# Patient Record
Sex: Male | Born: 1990 | Race: White | Hispanic: No
Health system: Southern US, Community
[De-identification: ages and names within clinical notes are randomized; demographics above are authoritative.]

## PROBLEM LIST (undated history)

## (undated) DIAGNOSIS — Z9109 Other allergy status, other than to drugs and biological substances: Secondary | ICD-10-CM

## (undated) DIAGNOSIS — Z8619 Personal history of other infectious and parasitic diseases: Secondary | ICD-10-CM

## (undated) DIAGNOSIS — J302 Other seasonal allergic rhinitis: Secondary | ICD-10-CM

## (undated) DIAGNOSIS — C8590 Non-Hodgkin lymphoma, unspecified, unspecified site: Secondary | ICD-10-CM

## (undated) HISTORY — DX: Non-Hodgkin lymphoma, unspecified, unspecified site: C85.90

## (undated) HISTORY — DX: Personal history of other infectious and parasitic diseases: Z86.19

## (undated) HISTORY — DX: Other seasonal allergic rhinitis: J30.2

## (undated) HISTORY — PX: NO PAST SURGERIES: SHX2092

## (undated) HISTORY — DX: Other allergy status, other than to drugs and biological substances: Z91.09

---

## 2015-08-24 ENCOUNTER — Telehealth: Payer: Self-pay | Admitting: Behavioral Health

## 2015-08-24 NOTE — Telephone Encounter (Signed)
Unable to reach patient at time of Pre-Visit Call.  Left message for patient to return call when available.    

## 2015-08-27 ENCOUNTER — Encounter: Payer: Self-pay | Admitting: Physician Assistant

## 2015-08-27 ENCOUNTER — Ambulatory Visit (INDEPENDENT_AMBULATORY_CARE_PROVIDER_SITE_OTHER): Payer: Managed Care, Other (non HMO) | Admitting: Physician Assistant

## 2015-08-27 VITALS — BP 114/86 | HR 95 | Temp 97.5°F | Resp 16 | Ht 67.0 in | Wt 152.2 lb

## 2015-08-27 DIAGNOSIS — Z8572 Personal history of non-Hodgkin lymphomas: Secondary | ICD-10-CM

## 2015-08-27 DIAGNOSIS — L309 Dermatitis, unspecified: Secondary | ICD-10-CM

## 2015-08-27 DIAGNOSIS — Z859 Personal history of malignant neoplasm, unspecified: Secondary | ICD-10-CM | POA: Diagnosis not present

## 2015-08-27 DIAGNOSIS — L819 Disorder of pigmentation, unspecified: Secondary | ICD-10-CM | POA: Diagnosis not present

## 2015-08-27 LAB — CBC
HEMATOCRIT: 48.7 % (ref 39.0–52.0)
Hemoglobin: 16.9 g/dL (ref 13.0–17.0)
MCHC: 34.7 g/dL (ref 30.0–36.0)
MCV: 89.3 fl (ref 78.0–100.0)
Platelets: 288 10*3/uL (ref 150.0–400.0)
RBC: 5.45 Mil/uL (ref 4.22–5.81)
RDW: 13.6 % (ref 11.5–15.5)
WBC: 8 10*3/uL (ref 4.0–10.5)

## 2015-08-27 MED ORDER — TRIAMCINOLONE ACETONIDE 0.5 % EX CREA: 1.0000 "application " | TOPICAL_CREAM | Freq: Three times a day (TID) | CUTANEOUS | Status: DC

## 2015-08-27 NOTE — Assessment & Plan Note (Signed)
Rx Kenalog ointment BID for 2 weeks then QD as needed. Supportive measures reviewed.

## 2015-08-27 NOTE — Progress Notes (Signed)
Pre visit review using our clinic review tool, if applicable. No additional management support is needed unless otherwise documented below in the visit note/SLS  

## 2015-08-27 NOTE — Patient Instructions (Signed)
Please go to the lab for blood work. I will call you with your results.  Keep your phone on as Dermatology will be calling you for assessment of your skin lesions.  Apply Kenalog cream to eczematous patches on back of legs. Keep up the daily Zyrtec.  Please try to get outside to increase your Vitamin D and try to stay active as it is good for cardiovascular health.

## 2015-08-27 NOTE — Assessment & Plan Note (Signed)
Will check CBC today as he has not had follow-up in several years.

## 2015-08-27 NOTE — Assessment & Plan Note (Signed)
With scaling and ulceration. Will refer to Dermatology for further assessment.

## 2015-08-27 NOTE — Progress Notes (Signed)
Patient presents to clinic today to establish care.  Acute Concerns: Patient complains of knots on skin of anterior legs that scab over x 1 year. Non pruritic and non-painful. Has never seen provider for this.  Patient also complains of dry, scaly and itch skin of posterior knees x multiple years. Endorses previous steroid cream that relieved symptoms  Chronic Issues: Patient with history of Lymphoma, remission x 5 years. Not currently followed by Oncology. Denies unexplainable fevers or fatigue.  Health Maintenance: Dental -- up-to-date Vision -- up-to-date   Past Medical History  Diagnosis Date  . Lymphoma     in Remission  . History of chicken pox   . Seasonal allergies   . Environmental allergies     Past Surgical History  Procedure Laterality Date  . No past surgeries      No current outpatient prescriptions on file prior to visit.   No current facility-administered medications on file prior to visit.    No Known Allergies  Family History  Problem Relation Age of Onset  . Healthy Father     Living  . Hypertension Mother     Living  . Other Mother     hydradenitis  . Allergies Mother   . Heart disease Maternal Grandfather   . Lymphoma Maternal Grandfather   . Hyperlipidemia Maternal Grandfather   . Aneurysm Maternal Grandfather   . COPD Maternal Grandfather   . Congestive Heart Failure Maternal Grandfather   . Heart attack Maternal Grandfather   . Arthritis Maternal Grandfather   . Hypertension Maternal Grandmother   . COPD Maternal Grandmother   . Asthma Maternal Grandmother   . Scoliosis Maternal Grandmother   . Breast cancer Maternal Aunt   . Cardiomyopathy Maternal Aunt   . Arthritis Maternal Aunt   . Aneurysm Maternal Uncle   . Diabetes Maternal Uncle   . Asthma Maternal Uncle   . Asthma Maternal Uncle     #2  . Healthy Brother   . Scoliosis Sister     Social History   Social History  . Marital Status: Single    Spouse Name: N/A    . Number of Children: N/A  . Years of Education: N/A   Occupational History  . Not on file.   Social History Main Topics  . Smoking status: Never Smoker   . Smokeless tobacco: Not on file  . Alcohol Use: Not on file  . Drug Use: Not on file  . Sexual Activity: Not on file   Other Topics Concern  . Not on file   Social History Narrative   Review of Systems  Constitutional: Negative for fever and malaise/fatigue.  Respiratory: Negative for cough.   Cardiovascular: Negative for chest pain and palpitations.  Skin: Positive for itching and rash.  Psychiatric/Behavioral: Negative for depression.    BP 114/86 mmHg  Pulse 95  Temp(Src) 97.5 F (36.4 C) (Oral)  Resp 16  Ht 5\' 7"  (1.702 m)  Wt 152 lb 4 oz (69.06 kg)  BMI 23.84 kg/m2  SpO2 98%  Physical Exam  Constitutional: He is well-developed, well-nourished, and in no distress.  HENT:  Head: Normocephalic and atraumatic.  Eyes: Conjunctivae are normal.  Neck: Neck supple.  Cardiovascular: Normal rate, regular rhythm, normal heart sounds and intact distal pulses.   Pulmonary/Chest: Effort normal and breath sounds normal. No respiratory distress. He has no wheezes. He has no rales. He exhibits no tenderness.  Neurological: He is alert.  Skin:     Vitals  reviewed.   No results found for this or any previous visit (from the past 2160 hour(s)).  Assessment/Plan: Atypical pigmented skin lesion With scaling and ulceration. Will refer to Dermatology for further assessment.  Eczema Rx Kenalog ointment BID for 2 weeks then QD as needed. Supportive measures reviewed.  Hx of lymphoma Will check CBC today as he has not had follow-up in several years.

## 2015-12-20 ENCOUNTER — Telehealth: Payer: Self-pay | Admitting: Physician Assistant

## 2015-12-20 NOTE — Telephone Encounter (Signed)
Patient will be getting his Flu Shot at [facility] next week. Will call with date.

## 2016-03-11 ENCOUNTER — Telehealth: Payer: Self-pay | Admitting: Physician Assistant

## 2016-03-11 NOTE — Telephone Encounter (Signed)
lvm inquiring if patient received flu shot  °

## 2018-05-19 ENCOUNTER — Encounter: Payer: Self-pay | Admitting: Emergency Medicine

## 2019-05-13 ENCOUNTER — Other Ambulatory Visit: Payer: Self-pay

## 2019-05-13 ENCOUNTER — Ambulatory Visit: Payer: Self-pay | Admitting: Family Medicine

## 2019-05-13 DIAGNOSIS — Z5329 Procedure and treatment not carried out because of patient's decision for other reasons: Secondary | ICD-10-CM

## 2019-05-13 DIAGNOSIS — Z91199 Patient's noncompliance with other medical treatment and regimen due to unspecified reason: Secondary | ICD-10-CM

## 2019-05-13 NOTE — Progress Notes (Signed)
Pt did not respond after several attempts to contact him.

## 2020-03-23 ENCOUNTER — Ambulatory Visit: Payer: Self-pay | Attending: Internal Medicine

## 2020-05-29 ENCOUNTER — Other Ambulatory Visit: Payer: Self-pay

## 2020-05-29 ENCOUNTER — Encounter (HOSPITAL_BASED_OUTPATIENT_CLINIC_OR_DEPARTMENT_OTHER): Payer: Self-pay | Admitting: *Deleted

## 2020-05-29 ENCOUNTER — Emergency Department (HOSPITAL_BASED_OUTPATIENT_CLINIC_OR_DEPARTMENT_OTHER)
Admission: EM | Admit: 2020-05-29 | Discharge: 2020-05-29 | Disposition: A | Discharge status: Home / Self Care | Payer: Self-pay | Attending: Emergency Medicine | Admitting: Emergency Medicine

## 2020-05-29 ENCOUNTER — Emergency Department (HOSPITAL_BASED_OUTPATIENT_CLINIC_OR_DEPARTMENT_OTHER): Payer: Self-pay

## 2020-05-29 DIAGNOSIS — F17210 Nicotine dependence, cigarettes, uncomplicated: Secondary | ICD-10-CM | POA: Insufficient documentation

## 2020-05-29 DIAGNOSIS — R531 Weakness: Secondary | ICD-10-CM | POA: Insufficient documentation

## 2020-05-29 DIAGNOSIS — Z20822 Contact with and (suspected) exposure to covid-19: Secondary | ICD-10-CM | POA: Insufficient documentation

## 2020-05-29 LAB — RETICULOCYTES
Immature Retic Fract: 35.5 % — ABNORMAL HIGH (ref 2.3–15.9)
RBC.: 2.21 MIL/uL — ABNORMAL LOW (ref 4.22–5.81)
Retic Count, Absolute: 1.4 10*3/uL — ABNORMAL LOW (ref 19.0–186.0)
Retic Ct Pct: 6.3 % — ABNORMAL HIGH (ref 0.4–3.1)

## 2020-05-29 LAB — URINALYSIS, ROUTINE W REFLEX MICROSCOPIC
Bilirubin Urine: NEGATIVE
Glucose, UA: NEGATIVE mg/dL
Hgb urine dipstick: NEGATIVE
Ketones, ur: NEGATIVE mg/dL
Leukocytes,Ua: NEGATIVE
Nitrite: NEGATIVE
Protein, ur: NEGATIVE mg/dL
Specific Gravity, Urine: <1.005 — ABNORMAL LOW (ref 1.005–1.030)
pH: 7 (ref 5.0–8.0)

## 2020-05-29 LAB — BASIC METABOLIC PANEL
Anion gap: 9 (ref 5–15)
BUN: 17 mg/dL (ref 6–20)
CO2: 25 mmol/L (ref 22–32)
Calcium: 9.3 mg/dL (ref 8.9–10.3)
Chloride: 103 mmol/L (ref 98–111)
Creatinine, Ser: 0.94 mg/dL (ref 0.61–1.24)
GFR calc Af Amer: >60 mL/min (ref >60)
GFR calc non Af Amer: >60 mL/min (ref >60)
Glucose, Bld: 121 mg/dL — ABNORMAL HIGH (ref 70–99)
Potassium: 3.7 mmol/L (ref 3.5–5.1)
Sodium: 137 mmol/L (ref 135–145)

## 2020-05-29 LAB — CBC WITH DIFFERENTIAL/PLATELET
Abs Immature Granulocytes: 0.74 10*3/uL — ABNORMAL HIGH (ref 0.00–0.07)
Basophils Absolute: 0.1 10*3/uL (ref 0.0–0.1)
Basophils Relative: 1 %
Eosinophils Absolute: 1.2 10*3/uL — ABNORMAL HIGH (ref 0.0–0.5)
Eosinophils Relative: 6 %
Hemoglobin: 9.9 g/dL — ABNORMAL LOW (ref 13.0–17.0)
Immature Granulocytes: 4 %
Lymphocytes Relative: 21 %
Lymphs Abs: 3.9 10*3/uL (ref 0.7–4.0)
Monocytes Absolute: 1.6 10*3/uL — ABNORMAL HIGH (ref 0.1–1.0)
Monocytes Relative: 9 %
Neutro Abs: 11.3 10*3/uL — ABNORMAL HIGH (ref 1.7–7.7)
Neutrophils Relative %: 59 %
Platelets: 352 10*3/uL (ref 150–400)
WBC: 18.8 10*3/uL — ABNORMAL HIGH (ref 4.0–10.5)

## 2020-05-29 LAB — LACTIC ACID, PLASMA: Lactic Acid, Venous: 1.1 mmol/L (ref 0.5–1.9)

## 2020-05-29 LAB — VITAMIN B12: Vitamin B-12: 1136 pg/mL — ABNORMAL HIGH (ref 180–914)

## 2020-05-29 LAB — SARS CORONAVIRUS 2 BY RT PCR (HOSPITAL ORDER, PERFORMED IN ~~LOC~~ HOSPITAL LAB): SARS Coronavirus 2: NEGATIVE

## 2020-05-29 LAB — IRON AND TIBC
Iron: 245 ug/dL — ABNORMAL HIGH (ref 45–182)
Saturation Ratios: 85 % — ABNORMAL HIGH (ref 17.9–39.5)
TIBC: 290 ug/dL (ref 250–450)
UIBC: 45 ug/dL

## 2020-05-29 LAB — FERRITIN: Ferritin: 668 ng/mL — ABNORMAL HIGH (ref 24–336)

## 2020-05-29 LAB — FOLATE: Folate: 14.6 ng/mL (ref >5.9)

## 2020-05-29 MED ORDER — LACTATED RINGERS IV BOLUS
1000.0000 mL | Freq: Once | INTRAVENOUS | Status: AC
  Administered 2020-05-29: 1000 mL via INTRAVENOUS

## 2020-05-29 MED ORDER — KETOROLAC TROMETHAMINE 15 MG/ML IJ SOLN
15.0000 mg | Freq: Once | INTRAMUSCULAR | Status: AC
  Administered 2020-05-29: 15 mg via INTRAVENOUS
  Filled 2020-05-29: qty 1

## 2020-05-29 MED ORDER — SODIUM CHLORIDE 0.9 % IV BOLUS: 1000.0000 mL | Freq: Once | INTRAVENOUS | Status: DC

## 2020-05-29 MED ORDER — ACETAMINOPHEN 500 MG PO TABS
1000.0000 mg | ORAL_TABLET | Freq: Once | ORAL | Status: AC
  Administered 2020-05-29: 1000 mg via ORAL
  Filled 2020-05-29: qty 2

## 2020-05-29 NOTE — Discharge Instructions (Addendum)
I suspect he may potentially have a viral illness.  I want you to drink plenty of fluids and you may take ibuprofen or Tylenol as needed for aches/pains.  Your blood pressure has been elevated here in the emergency room.  I want you to follow-up in 1 to 2 weeks to have this rechecked.  Additionally, your blood work shows that you are anemic.  I am not sure of the reason for this. I don't have any recent labs for comparison. When you follow-up for your blood pressure you should also have a recheck CBC (complete blood count).

## 2020-05-29 NOTE — ED Provider Notes (Signed)
[facility] EMERGENCY DEPARTMENT Provider Note   CSN: [id number] Arrival date & time: 05/29/20  1735     History Chief Complaint  Patient presents with  . Fever    [place] Stanton is a 29 y.o. male.  HPI   29 year old male with and generally not feeling well.  Patient is not a great historian.  Symptoms began a couple days ago.  States that he feels tired.  No acute pain.  No cough.  No urinary complaints.  No vomiting or diarrhea.  No rash.  Past Medical History:  Diagnosis Date  . Environmental allergies   . History of chicken pox   . Lymphoma ([place])    in Remission  . Seasonal allergies     Patient Active Problem List   Diagnosis Date Noted  . Hx of lymphoma 08/27/2015  . Atypical pigmented skin lesion 08/27/2015  . Eczema 08/27/2015    Past Surgical History:  Procedure Laterality Date  . NO PAST SURGERIES         Family History  Problem Relation Age of Onset  . Healthy Father        Living  . Hypertension Mother        Living  . Other Mother        hydradenitis  . Allergies Mother   . Heart disease Maternal Grandfather   . Lymphoma Maternal Grandfather   . Hyperlipidemia Maternal Grandfather   . Aneurysm Maternal Grandfather   . COPD Maternal Grandfather   . Congestive Heart Failure Maternal Grandfather   . Heart attack Maternal Grandfather   . Arthritis Maternal Grandfather   . Hypertension Maternal Grandmother   . COPD Maternal Grandmother   . Asthma Maternal Grandmother   . Scoliosis Maternal Grandmother   . Breast cancer Maternal Aunt   . Cardiomyopathy Maternal Aunt   . Arthritis Maternal Aunt   . Aneurysm Maternal Uncle   . Diabetes Maternal Uncle   . Asthma Maternal Uncle   . Asthma Maternal Uncle        #2  . Healthy Brother   . Scoliosis Sister     Social History   Tobacco Use  . Smoking status: Current Some Day Smoker  . Smokeless tobacco: Never Used  Substance Use Topics  . Alcohol use: Yes    Alcohol/week: 0.0  standard drinks  . Drug use: Never    Home Medications Prior to Admission medications   Medication Sig Start Date End Date Taking? Authorizing Provider  cetirizine (ZYRTEC) 10 MG tablet Take 10 mg by mouth daily.   Yes [provider]  triamcinolone cream (KENALOG) 0.5 % Apply 1 application topically 3 (three) times daily. 08/27/15   Brunetta Jeans, PA-C    Allergies    Patient has no known allergies.  Review of Systems   Review of Systems All systems reviewed and negative, other than as noted in HPI.  Physical Exam Updated Vital Signs BP (!) 155/98 (BP Location: Left Arm)   Pulse (!) 110   Temp 99.2 F (37.3 C) (Oral)   Resp (!) 22   Ht 5\' 7"  (1.702 m)   Wt 75.8 kg   SpO2 100%   BMI 26.16 kg/m   Physical Exam Vitals and nursing note reviewed.  Constitutional:      General: He is not in acute distress.    Appearance: He is well-developed.  HENT:     Head: Normocephalic and atraumatic.  Eyes:     General:  Right eye: No discharge.        Left eye: No discharge.     Conjunctiva/sclera: Conjunctivae normal.  Cardiovascular:     Rate and Rhythm: Normal rate and regular rhythm.     Heart sounds: Normal heart sounds. No murmur. No friction rub. No gallop.   Pulmonary:     Effort: Pulmonary effort is normal. No respiratory distress.     Breath sounds: Normal breath sounds.  Abdominal:     General: There is no distension.     Palpations: Abdomen is soft.     Tenderness: There is no abdominal tenderness.  Musculoskeletal:        General: No tenderness.     Cervical back: Neck supple.  Skin:    General: Skin is warm and dry.  Neurological:     Mental Status: He is alert.  Psychiatric:        Behavior: Behavior normal.        Thought Content: Thought content normal.     ED Results / Procedures / Treatments   Labs (all labs ordered are listed, but only abnormal results are displayed) Labs Reviewed  CBC WITH DIFFERENTIAL/PLATELET - Abnormal;  Notable for the following components:      Result Value   WBC 18.8 (*)    Hemoglobin 9.9 (*)    Neutro Abs 11.3 (*)    Monocytes Absolute 1.6 (*)    Eosinophils Absolute 1.2 (*)    Abs Immature Granulocytes 0.74 (*)    All other components within normal limits  BASIC METABOLIC PANEL - Abnormal; Notable for the following components:   Glucose, Bld 121 (*)    All other components within normal limits  URINALYSIS, ROUTINE W REFLEX MICROSCOPIC - Abnormal; Notable for the following components:   Specific Gravity, Urine <1.005 (*)    All other components within normal limits  VITAMIN B12 - Abnormal; Notable for the following components:   Vitamin B-12 1,136 (*)    All other components within normal limits  IRON AND TIBC - Abnormal; Notable for the following components:   Iron 245 (*)    Saturation Ratios 85 (*)    All other components within normal limits  FERRITIN - Abnormal; Notable for the following components:   Ferritin 668 (*)    All other components within normal limits  RETICULOCYTES - Abnormal; Notable for the following components:   Retic Ct Pct 6.3 (*)    RBC. 2.21 (*)    Retic Count, Absolute 1.4 (*)    Immature Retic Fract 35.5 (*)    All other components within normal limits  SARS CORONAVIRUS 2 BY RT PCR (HOSPITAL ORDER, [facility])  CULTURE, BLOOD (ROUTINE X 2)  CULTURE, BLOOD (ROUTINE X 2)  LACTIC ACID, PLASMA  FOLATE    EKG None  Radiology DG Chest Portable 1 View  Result Date: 05/29/2020 CLINICAL DATA:  Weakness.  Fatigue.  Cough. EXAM: PORTABLE CHEST 1 VIEW COMPARISON:  None. FINDINGS: Midline trachea.  Normal heart size and mediastinal contours. Sharp costophrenic angles.  No pneumothorax.  Clear lungs. IMPRESSION: No active disease. Electronically Signed   By: Abigail Miyamoto M.D.   On: 05/29/2020 18:50    Procedures Procedures (including critical care time)  Medications Ordered in ED Medications  sodium chloride 0.9 % bolus  1,000 mL (1,000 mLs Intravenous Not Given 05/29/20 1935)  acetaminophen (TYLENOL) tablet 1,000 mg (1,000 mg Oral Given 05/29/20 1824)  lactated ringers bolus 1,000 mL ( Intravenous  Stopped 05/29/20 1918)    ED Course  I have reviewed the triage vital signs and the nursing notes.  Pertinent labs & imaging results that were available during my care of the patient were reviewed by me and considered in my medical decision making (see chart for details).    MDM Rules/Calculators/A&P                      29 year old male with generally not feeling well.  Afebrile.  He does have a leukocytosis though.  Unclear etiology.  Nontoxic.  Chest x-ray clear.  Really no clear source based on his symptoms.  Also noted to be anemic.  This is a new diagnosis per his mother.  Suspect is probably chronic to some degree although I do not have recent labs for comparison purposes.  Will send anemia panel.  Needs to follow-up as an outpatient.  He also needs to have recheck of his blood pressure which is noted to be consistently elevated here in the emergency room.  His mother states that he does not like going to the doctor.  Tried to encourage him and discussed the overall importance of this.  Emergent return precautions were discussed.  Final Clinical Impression(s) / ED Diagnoses Final diagnoses:  Generalized weakness    Rx / DC Orders ED Discharge Orders    None       Virgel Manifold, MD 05/31/20 1052

## 2020-05-29 NOTE — ED Triage Notes (Signed)
Weak, fever, cough,  light headed, sob, fatigue, x 2 days.

## 2020-05-29 NOTE — ED Notes (Signed)
Family at bedside.ED Provider at bedside. 

## 2020-06-03 LAB — CULTURE, BLOOD (ROUTINE X 2)
Culture: NO GROWTH
Culture: NO GROWTH
Special Requests: ADEQUATE
Special Requests: ADEQUATE

## 2021-05-07 IMAGING — DX DG CHEST 1V PORT
1 series · 1 of 1 positions shown · non-contrast
Comparison: None.

CLINICAL DATA: Weakness.  Fatigue.  Cough.

EXAM:
PORTABLE CHEST 1 VIEW

[chest ap]
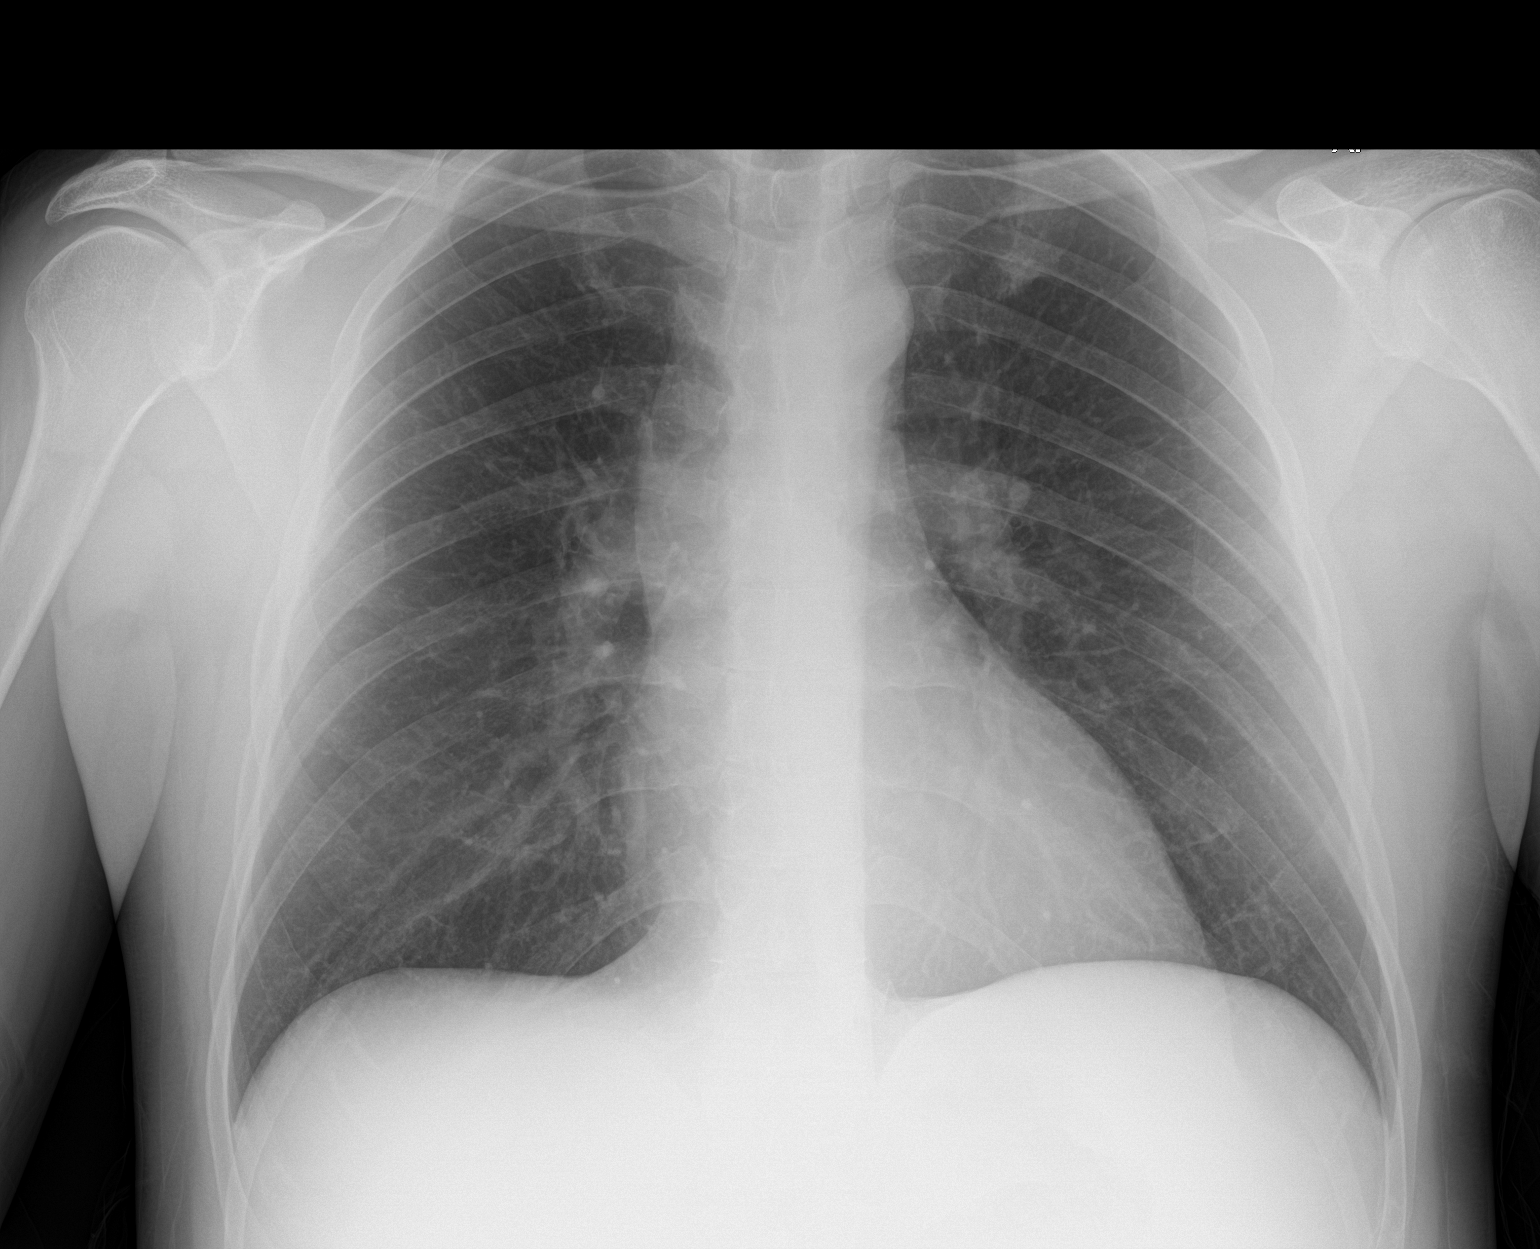

[1 of 1 positions shown; findings below may reference images not displayed]

FINDINGS: Midline trachea.  Normal heart size and mediastinal contours.

Sharp costophrenic angles.  No pneumothorax.  Clear lungs.
IMPRESSION: No active disease.

## 2023-11-16 ENCOUNTER — Inpatient Hospital Stay (HOSPITAL_BASED_OUTPATIENT_CLINIC_OR_DEPARTMENT_OTHER)
Admission: EM | Admit: 2023-11-16 | Discharge: 2023-11-18 | DRG: 871 | Disposition: A | Discharge status: Home / Self Care | Payer: BC Managed Care – PPO | Attending: Internal Medicine | Admitting: Internal Medicine

## 2023-11-16 ENCOUNTER — Emergency Department (HOSPITAL_BASED_OUTPATIENT_CLINIC_OR_DEPARTMENT_OTHER): Payer: BC Managed Care – PPO

## 2023-11-16 ENCOUNTER — Other Ambulatory Visit: Payer: Self-pay

## 2023-11-16 ENCOUNTER — Encounter (HOSPITAL_BASED_OUTPATIENT_CLINIC_OR_DEPARTMENT_OTHER): Payer: Self-pay | Admitting: Urology

## 2023-11-16 DIAGNOSIS — F17210 Nicotine dependence, cigarettes, uncomplicated: Secondary | ICD-10-CM | POA: Diagnosis present

## 2023-11-16 DIAGNOSIS — Z8261 Family history of arthritis: Secondary | ICD-10-CM

## 2023-11-16 DIAGNOSIS — Z825 Family history of asthma and other chronic lower respiratory diseases: Secondary | ICD-10-CM | POA: Diagnosis not present

## 2023-11-16 DIAGNOSIS — Z8572 Personal history of non-Hodgkin lymphomas: Secondary | ICD-10-CM

## 2023-11-16 DIAGNOSIS — R9389 Abnormal findings on diagnostic imaging of other specified body structures: Secondary | ICD-10-CM | POA: Diagnosis not present

## 2023-11-16 DIAGNOSIS — Z8349 Family history of other endocrine, nutritional and metabolic diseases: Secondary | ICD-10-CM

## 2023-11-16 DIAGNOSIS — J189 Pneumonia, unspecified organism: Principal | ICD-10-CM | POA: Diagnosis present

## 2023-11-16 DIAGNOSIS — D591 Autoimmune hemolytic anemia, unspecified: Secondary | ICD-10-CM | POA: Diagnosis present

## 2023-11-16 DIAGNOSIS — Z1152 Encounter for screening for COVID-19: Secondary | ICD-10-CM | POA: Diagnosis not present

## 2023-11-16 DIAGNOSIS — I4510 Unspecified right bundle-branch block: Secondary | ICD-10-CM | POA: Diagnosis present

## 2023-11-16 DIAGNOSIS — Z807 Family history of other malignant neoplasms of lymphoid, hematopoietic and related tissues: Secondary | ICD-10-CM | POA: Diagnosis not present

## 2023-11-16 DIAGNOSIS — Z8249 Family history of ischemic heart disease and other diseases of the circulatory system: Secondary | ICD-10-CM

## 2023-11-16 DIAGNOSIS — R591 Generalized enlarged lymph nodes: Secondary | ICD-10-CM | POA: Diagnosis present

## 2023-11-16 DIAGNOSIS — A419 Sepsis, unspecified organism: Principal | ICD-10-CM | POA: Diagnosis present

## 2023-11-16 DIAGNOSIS — Z833 Family history of diabetes mellitus: Secondary | ICD-10-CM

## 2023-11-16 DIAGNOSIS — Z803 Family history of malignant neoplasm of breast: Secondary | ICD-10-CM

## 2023-11-16 LAB — COMPREHENSIVE METABOLIC PANEL
ALT: 17 U/L (ref 0–44)
AST: 16 U/L (ref 15–41)
Albumin: 3.4 g/dL — ABNORMAL LOW (ref 3.5–5.0)
Alkaline Phosphatase: 114 U/L (ref 38–126)
Anion gap: 12 (ref 5–15)
BUN: 13 mg/dL (ref 6–20)
CO2: 23 mmol/L (ref 22–32)
Calcium: 8.5 mg/dL — ABNORMAL LOW (ref 8.9–10.3)
Chloride: 99 mmol/L (ref 98–111)
Creatinine, Ser: 1.02 mg/dL (ref 0.61–1.24)
GFR, Estimated: >60 mL/min (ref >60)
Glucose, Bld: 86 mg/dL (ref 70–99)
Potassium: 3.5 mmol/L (ref 3.5–5.1)
Sodium: 134 mmol/L — ABNORMAL LOW (ref 135–145)
Total Bilirubin: 0.6 mg/dL (ref <1.2)
Total Protein: 7.6 g/dL (ref 6.5–8.1)

## 2023-11-16 LAB — CBC WITH DIFFERENTIAL/PLATELET
Abs Immature Granulocytes: 0.14 10*3/uL — ABNORMAL HIGH (ref 0.00–0.07)
Basophils Absolute: 0 10*3/uL (ref 0.0–0.1)
Basophils Relative: 0 %
Eosinophils Absolute: 0.2 10*3/uL (ref 0.0–0.5)
Eosinophils Relative: 1 %
HCT: 39.3 % (ref 39.0–52.0)
Hemoglobin: 13.6 g/dL (ref 13.0–17.0)
Immature Granulocytes: 1 %
Lymphocytes Relative: 6 %
Lymphs Abs: 1.3 10*3/uL (ref 0.7–4.0)
MCH: 31.2 pg (ref 26.0–34.0)
MCHC: 34.6 g/dL (ref 30.0–36.0)
MCV: 90.1 fL (ref 80.0–100.0)
Monocytes Absolute: 1 10*3/uL (ref 0.1–1.0)
Monocytes Relative: 5 %
Neutro Abs: 18.9 10*3/uL — ABNORMAL HIGH (ref 1.7–7.7)
Neutrophils Relative %: 87 %
Platelets: 364 10*3/uL (ref 150–400)
RBC: 4.36 MIL/uL (ref 4.22–5.81)
RDW: 12.9 % (ref 11.5–15.5)
WBC: 21.6 10*3/uL — ABNORMAL HIGH (ref 4.0–10.5)
nRBC: 0 % (ref 0.0–0.2)

## 2023-11-16 LAB — D-DIMER, QUANTITATIVE: D-Dimer, Quant: 0.81 ug{FEU}/mL — ABNORMAL HIGH (ref 0.00–0.50)

## 2023-11-16 LAB — RESP PANEL BY RT-PCR (RSV, FLU A&B, COVID)  RVPGX2
Influenza A by PCR: NEGATIVE
Influenza B by PCR: NEGATIVE
Resp Syncytial Virus by PCR: NEGATIVE
SARS Coronavirus 2 by RT PCR: NEGATIVE

## 2023-11-16 LAB — LACTIC ACID, PLASMA: Lactic Acid, Venous: 0.9 mmol/L (ref 0.5–1.9)

## 2023-11-16 MED ORDER — SODIUM CHLORIDE 0.9 % IV BOLUS
1000.0000 mL | Freq: Once | INTRAVENOUS | Status: AC
  Administered 2023-11-16: 1000 mL via INTRAVENOUS

## 2023-11-16 MED ORDER — SODIUM CHLORIDE 0.9 % IV SOLN
1.0000 g | INTRAVENOUS | Status: DC
  Administered 2023-11-17 – 2023-11-18 (×2): 1 g via INTRAVENOUS
  Filled 2023-11-16 (×2): qty 10

## 2023-11-16 MED ORDER — BENZONATATE 100 MG PO CAPS: 100.0000 mg | ORAL_CAPSULE | Freq: Two times a day (BID) | ORAL | Status: DC | PRN

## 2023-11-16 MED ORDER — ENOXAPARIN SODIUM 40 MG/0.4ML IJ SOSY
40.0000 mg | PREFILLED_SYRINGE | INTRAMUSCULAR | Status: DC
  Administered 2023-11-17 – 2023-11-18 (×2): 40 mg via SUBCUTANEOUS
  Filled 2023-11-16 (×2): qty 0.4

## 2023-11-16 MED ORDER — SODIUM CHLORIDE 0.9 % IV SOLN
1.0000 g | Freq: Once | INTRAVENOUS | Status: AC
  Administered 2023-11-16: 1 g via INTRAVENOUS
  Filled 2023-11-16: qty 10

## 2023-11-16 MED ORDER — ACETAMINOPHEN 325 MG PO TABS: 650.0000 mg | ORAL_TABLET | Freq: Four times a day (QID) | ORAL | Status: DC | PRN

## 2023-11-16 MED ORDER — IOHEXOL 350 MG/ML SOLN
75.0000 mL | Freq: Once | INTRAVENOUS | Status: AC | PRN
  Administered 2023-11-16: 75 mL via INTRAVENOUS

## 2023-11-16 MED ORDER — DEXTROSE 5 % IV SOLN
500.0000 mg | Freq: Once | INTRAVENOUS | Status: AC
  Administered 2023-11-16: 500 mg via INTRAVENOUS
  Filled 2023-11-16: qty 5

## 2023-11-16 MED ORDER — MELATONIN 3 MG PO TABS: 3.0000 mg | ORAL_TABLET | Freq: Every evening | ORAL | Status: DC | PRN

## 2023-11-16 MED ORDER — ONDANSETRON HCL 4 MG/2ML IJ SOLN: 4.0000 mg | Freq: Four times a day (QID) | INTRAMUSCULAR | Status: DC | PRN

## 2023-11-16 MED ORDER — AZITHROMYCIN 250 MG PO TABS
500.0000 mg | ORAL_TABLET | Freq: Every day | ORAL | Status: DC
  Administered 2023-11-17 – 2023-11-18 (×2): 500 mg via ORAL
  Filled 2023-11-16 (×2): qty 2

## 2023-11-16 NOTE — ED Notes (Signed)
[facility] at bedside 

## 2023-11-16 NOTE — ED Notes (Signed)
Patient transported to CT 

## 2023-11-16 NOTE — ED Notes (Signed)
Pt returned from CT and ambulated 2 laps around the nurses station. HR 110-113 and O2 sats remained between 95-100%. Pt denied dizziness or shortness of breath.

## 2023-11-16 NOTE — H&P (Signed)
History and Physical    Patient: [place] Stanton ION:[id number] DOB: 02-04-1991 DOA: 11/16/2023 DOS: the patient was seen and examined on 11/16/2023 PCP: Patient, No Pcp Per  Patient coming from: Home  Chief Complaint:  Chief Complaint  Patient presents with   Flu like Symptoms    HPI: [place] Stanton is a 32 y.o. male with medical history significant for lymphoma when he was 32 years old who presents with 2 weeks of persistent cough and shortness of breath.  He has had some intermittent fevers but his appetite is fine and no chest pain.  He came to the ER today because the symptoms were persistent.  In the emergency department he was found to be mildly hypoxic and a CT scan of his chest revealed extensive consolidative left lower lobe pneumonia and abnormal adenopathy. He did meet sepsis criteria as well with persistent tachycardia and elevated white blood cell count.  His COVID and flu were negative.  The patient is being admitted by [facility] hospitalist for IV antibiotics.  Review of Systems: As mentioned in the history of present illness. All other systems reviewed and are negative. Past Medical History:  Diagnosis Date   Environmental allergies    History of chicken pox    Lymphoma ([facility])    in Remission   Seasonal allergies    Past Surgical History:  Procedure Laterality Date   NO PAST SURGERIES     Social History:  reports that he has been smoking cigarettes. He has never used smokeless tobacco. He reports current alcohol use. He reports that he does not use drugs. He works at [employer].  No Known Allergies  Family History  Problem Relation Age of Onset   Healthy Father        Living   Hypertension Mother        Living   Other Mother        hydradenitis   Allergies Mother    Heart disease Maternal Grandfather    Lymphoma Maternal Grandfather    Hyperlipidemia Maternal Grandfather    Aneurysm Maternal Grandfather    COPD Maternal Grandfather    Congestive Heart Failure  Maternal Grandfather    Heart attack Maternal Grandfather    Arthritis Maternal Grandfather    Hypertension Maternal Grandmother    COPD Maternal Grandmother    Asthma Maternal Grandmother    Scoliosis Maternal Grandmother    Breast cancer Maternal Aunt    Cardiomyopathy Maternal Aunt    Arthritis Maternal Aunt    Aneurysm Maternal Uncle    Diabetes Maternal Uncle    Asthma Maternal Uncle    Asthma Maternal Uncle        #2   Healthy Brother    Scoliosis Sister     Prior to Admission medications   Medication Sig Start Date End Date Taking? Authorizing Provider  cetirizine (ZYRTEC) 10 MG tablet Take 10 mg by mouth daily.   Yes [provider]    Physical Exam: Vitals:   11/16/23 1930 11/16/23 2000 11/16/23 2015 11/16/23 2045  BP: (!) 144/108 (!) 152/106 (!) 199/98 (!) 158/110  Pulse: 91 91 (!) 114 98  Resp: 20 (!) 21 15 18   Temp:      SpO2: 98% 96% 97% 97%  Weight:      Height:       Physical Exam:  General: No acute distress, well developed, thin HEENT: Normocephalic, atraumatic, PERRL Cardiovascular: Normal rate and rhythm. Distal pulses intact. Pulmonary: Normal pulmonary effort, normal breath sounds Gastrointestinal:  Nondistended abdomen, soft, non-tender, normoactive bowel sounds Musculoskeletal:Normal ROM, no lower ext edema Lymphadenopathy: No cervical LAD. Skin: Skin is warm and dry. Neuro: No focal deficits noted, AAOx3. PSYCH: Attentive and cooperative  Data Reviewed:  Results for orders placed or performed during the hospital encounter of 11/16/23 (from the past 24 hour(s))  Resp panel by RT-PCR (RSV, Flu A&B, Covid) Anterior Nasal Swab     Status: None   Collection Time: 11/16/23  2:06 PM   Specimen: Anterior Nasal Swab  Result Value Ref Range   SARS Coronavirus 2 by RT PCR NEGATIVE NEGATIVE   Influenza A by PCR NEGATIVE NEGATIVE   Influenza B by PCR NEGATIVE NEGATIVE   Resp Syncytial Virus by PCR NEGATIVE NEGATIVE  D-dimer, quantitative      Status: Abnormal   Collection Time: 11/16/23  2:40 PM  Result Value Ref Range   D-Dimer, Quant 0.81 (H) 0.00 - 0.50 ug/mL-FEU  CBC with Differential     Status: Abnormal   Collection Time: 11/16/23  2:51 PM  Result Value Ref Range   WBC 21.6 (H) 4.0 - 10.5 K/uL   RBC 4.36 4.22 - 5.81 MIL/uL   Hemoglobin 13.6 13.0 - 17.0 g/dL   HCT 98.1 19.1 - 47.8 %   MCV 90.1 80.0 - 100.0 fL   MCH 31.2 26.0 - 34.0 pg   MCHC 34.6 30.0 - 36.0 g/dL   RDW 29.5 62.1 - 30.8 %   Platelets 364 150 - 400 K/uL   nRBC 0.0 0.0 - 0.2 %   Neutrophils Relative % 87 %   Neutro Abs 18.9 (H) 1.7 - 7.7 K/uL   Lymphocytes Relative 6 %   Lymphs Abs 1.3 0.7 - 4.0 K/uL   Monocytes Relative 5 %   Monocytes Absolute 1.0 0.1 - 1.0 K/uL   Eosinophils Relative 1 %   Eosinophils Absolute 0.2 0.0 - 0.5 K/uL   Basophils Relative 0 %   Basophils Absolute 0.0 0.0 - 0.1 K/uL   Immature Granulocytes 1 %   Abs Immature Granulocytes 0.14 (H) 0.00 - 0.07 K/uL  Comprehensive metabolic panel     Status: Abnormal   Collection Time: 11/16/23  2:51 PM  Result Value Ref Range   Sodium 134 (L) 135 - 145 mmol/L   Potassium 3.5 3.5 - 5.1 mmol/L   Chloride 99 98 - 111 mmol/L   CO2 23 22 - 32 mmol/L   Glucose, Bld 86 70 - 99 mg/dL   BUN 13 6 - 20 mg/dL   Creatinine, Ser 6.57 0.61 - 1.24 mg/dL   Calcium 8.5 (L) 8.9 - 10.3 mg/dL   Total Protein 7.6 6.5 - 8.1 g/dL   Albumin 3.4 (L) 3.5 - 5.0 g/dL   AST 16 15 - 41 U/L   ALT 17 0 - 44 U/L   Alkaline Phosphatase 114 38 - 126 U/L   Total Bilirubin 0.6 <1.2 mg/dL   GFR, Estimated >84 >69 mL/min   Anion gap 12 5 - 15  Lactic acid, plasma     Status: None   Collection Time: 11/16/23  2:51 PM  Result Value Ref Range   Lactic Acid, Venous 0.9 0.5 - 1.9 mmol/L  Blood culture (routine x 2)     Status: None (Preliminary result)   Collection Time: 11/16/23  3:20 PM   Specimen: BLOOD RIGHT FOREARM  Result Value Ref Range   Specimen Description      BLOOD RIGHT FOREARM Performed at [facility], [address], [place], [place] [address]    Special Requests      BOTTLES DRAWN AEROBIC AND ANAEROBIC Blood Culture adequate volume Performed at [facility], [address] [place], [place] [address]    Culture PENDING    Report Status PENDING    IMPRESSION: 1. Extensive left lower lobe peribronchovascular consolidative and ground-glass airspace opacities with mucous plugging. Interval development of tree-in-bud nodularity within the right lower lobe. Findings suggestive of infection/inflammation. 2. Mediastinal and left hilar lymphadenopathy. Prominent/upper limits of normal left axillary lymph nodes in the setting of left axillae dissection. Given lymphoma history history, consider PET-CT for further evaluation. 3. No pulmonary embolus.    Assessment and Plan: Consolidative, ground glass right lower lobe pneumonia - IV Rocephin and Zithromax CT scan with borderline left axillary lymphadenopathy and mediastinal and left hilar lymphadenopathy -PET scan recommended given history of lymphoma    Advance Care Planning:   Code Status: Not on file the patient names his mother as his surrogate decision maker and wants to be full code.  Consults: none  Family Communication: none  Severity of Illness: The appropriate patient status for this patient is INPATIENT. Inpatient status is judged to be reasonable and necessary in order to provide the required intensity of service to ensure the patient's safety. The patient's presenting symptoms, physical exam findings, and initial radiographic and laboratory data in the context of their chronic comorbidities is felt to place them at high risk for further clinical deterioration. Furthermore, it is not anticipated that the patient will be medically stable for discharge from the hospital within 2 midnights of admission.   * I certify that at the point of admission it is my clinical judgment that the patient will require  inpatient hospital care spanning beyond 2 midnights from the point of admission due to high intensity of service, high risk for further deterioration and high frequency of surveillance required.*  Author: Buena Irish, MD 11/16/2023 10:08 PM  For on call review [email or web address].

## 2023-11-16 NOTE — ED Provider Notes (Signed)
[facility] Provider Note   CSN: [id number] Arrival date & time: 11/16/23  1358    History  Chief Complaint  Patient presents with   Flu like Symptoms     Nicholas Stanton is a 32 y.o. male hx significant for lymphoma, AIHA s/p EBV here for 2 weeks of "flu" like symptoms. Cough, congestion, fever, fatigue. No recent travel, sick contacts. Coughing up yellow phlegm. CP with coughing, No SOB.  No pain or swelling to lower extremities. Smokes 0.5 pack day. No N,V,D, dysuria, hematuria.  Friend and helps provide history.  He has had ongoing weight loss over the last year or so.  He states this is been an longstanding problem he is being followed by [facility] heme-onc for this as well as for his prior lymphoma, autoimmune hemolytic anemia.  He denies any night sweats.  States he is eating and drinking "normally."  Tylenol cold, flu which helped  HPI     Home Medications Prior to Admission medications   Medication Sig Start Date End Date Taking? Authorizing Provider  cetirizine (ZYRTEC) 10 MG tablet Take 10 mg by mouth daily.    [provider]  triamcinolone cream (KENALOG) 0.5 % Apply 1 application topically 3 (three) times daily. 08/27/15   Waldon Merl, PA-C      Allergies    Patient has no known allergies.    Review of Systems   Review of Systems  Constitutional:  Positive for fatigue and fever.  HENT: Negative.    Respiratory:  Positive for cough. Negative for apnea, choking, chest tightness, shortness of breath, wheezing and stridor.   Cardiovascular:  Positive for chest pain (with coughing).  Gastrointestinal: Negative.   Genitourinary: Negative.   Musculoskeletal: Negative.   Skin: Negative.   Neurological: Negative.   All other systems reviewed and are negative.   Physical Exam Updated Vital Signs BP (!) 158/110   Pulse 98   Temp 97.8 F (36.6 C)   Resp 18   Ht 5\' 7"  (1.702 m)   Wt 55.8 kg   SpO2 97%    BMI 19.26 kg/m  Physical Exam Vitals and nursing note reviewed.  Constitutional:      General: He is not in acute distress.    Appearance: He is well-developed. He is not ill-appearing, toxic-appearing or diaphoretic.  HENT:     Head: Normocephalic and atraumatic.     Nose: Nose normal.     Mouth/Throat:     Mouth: Mucous membranes are moist.  Eyes:     Pupils: Pupils are equal, round, and reactive to light.  Cardiovascular:     Rate and Rhythm: Normal rate and regular rhythm.     Pulses: Normal pulses.          Radial pulses are 2+ on the right side and 2+ on the left side.     Heart sounds: Normal heart sounds.  Pulmonary:     Effort: Pulmonary effort is normal. No respiratory distress.     Breath sounds: Rhonchi present.     Comments: Mild rhonchi left lower lobe.  Speaks in full sentences without difficulty.  Mild tenderness left anterior wall Chest:     Chest wall: Tenderness present.  Abdominal:     General: Bowel sounds are normal. There is no distension.     Palpations: Abdomen is soft.     Tenderness: There is no abdominal tenderness. There is no right CVA tenderness, left CVA tenderness, guarding  or rebound.  Musculoskeletal:        General: No swelling, tenderness or signs of injury. Normal range of motion.     Cervical back: Normal range of motion and neck supple.     Right lower leg: No edema.     Left lower leg: No edema.     Comments: Soft, nontender, compartment soft, no lower extremity edema  Skin:    General: Skin is warm and dry.  Neurological:     General: No focal deficit present.     Mental Status: He is alert and oriented to person, place, and time.     ED Results / Procedures / Treatments   Labs (all labs ordered are listed, but only abnormal results are displayed) Labs Reviewed  CBC WITH DIFFERENTIAL/PLATELET - Abnormal; Notable for the following components:      Result Value   WBC 21.6 (*)    Neutro Abs 18.9 (*)    Abs Immature  Granulocytes 0.14 (*)    All other components within normal limits  COMPREHENSIVE METABOLIC PANEL - Abnormal; Notable for the following components:   Sodium 134 (*)    Calcium 8.5 (*)    Albumin 3.4 (*)    All other components within normal limits  D-DIMER, QUANTITATIVE - Abnormal; Notable for the following components:   D-Dimer, Quant 0.81 (*)    All other components within normal limits  RESP PANEL BY RT-PCR (RSV, FLU A&B, COVID)  RVPGX2  CULTURE, BLOOD (ROUTINE X 2)  CULTURE, BLOOD (ROUTINE X 2)  LACTIC ACID, PLASMA    EKG EKG Interpretation Date/Time:  Monday November 16 2023 14:52:32 EST Ventricular Rate:  98 PR Interval:  163 QRS Duration:  138 QT Interval:  403 QTC Calculation: 515 R Axis:   55  Text Interpretation: Sinus rhythm LAE, consider biatrial enlargement Right bundle branch block Left ventricular hypertrophy ST elev, probable normal early repol pattern Prolonged QT interval No old tracing to compare Confirmed by Benjiman Core [phone]) on 11/16/2023 2:54:46 PM  Radiology CT Angio Chest PE W and/or Wo Contrast  Result Date: 11/16/2023 CLINICAL DATA:  Pulmonary embolism (PE) suspected, low to intermediate prob, positive D-dimer cp Pt states has had cough, fever, body aches x 2 weeks. States productive cough. History of lymphoma. EXAM: CT ANGIOGRAPHY CHEST WITH CONTRAST TECHNIQUE: Multidetector CT imaging of the chest was performed using the standard protocol during bolus administration of intravenous contrast. Multiplanar CT image reconstructions and MIPs were obtained to evaluate the vascular anatomy. RADIATION DOSE REDUCTION: This exam was performed according to the departmental dose-optimization program which includes automated exposure control, adjustment of the mA and/or kV according to patient size and/or use of iterative reconstruction technique. CONTRAST:  75mL OMNIPAQUE IOHEXOL 350 MG/ML SOLN COMPARISON:  None Available. FINDINGS: Cardiovascular: Satisfactory  opacification of the pulmonary arteries to the segmental level. No evidence of pulmonary embolism. The main pulmonary artery is normal in caliber. Normal heart size. No significant pericardial effusion. The thoracic aorta is normal in caliber. No atherosclerotic plaque of the thoracic aorta. No coronary artery calcifications. Mediastinum/Nodes: A 1.3 cm prevascular enlarged lymph node. Enlarged precarinal lymph node measuring 1.1 cm. A 1 cm left hilar enlarged lymph node. Prominent but nonenlarged 0.9 cm left axillary lymph node. Left axillary lymph node dissection noted. Prominent but nonenlarged right axillary lymph nodes. No enlarged right hilar or axillary lymph nodes. Thyroid gland, trachea, and esophagus demonstrate no significant findings. Lungs/Pleura: Extensive left lower lobe peribronchovascular consolidative and ground-glass airspace opacities with  mucous plugging. Interval development of tree-in-bud nodularity within the right lower lobe. No pulmonary nodule. No pulmonary mass. No pleural effusion. No pneumothorax. Upper Abdomen: No acute abnormality. Musculoskeletal: No abdominal wall hernia or abnormality. No suspicious lytic or blastic osseous lesions. No acute displaced fracture. Multilevel degenerative changes of the spine. Review of the MIP images confirms the above findings. IMPRESSION: 1. Extensive left lower lobe peribronchovascular consolidative and ground-glass airspace opacities with mucous plugging. Interval development of tree-in-bud nodularity within the right lower lobe. Findings suggestive of infection/inflammation. 2. Mediastinal and left hilar lymphadenopathy. Prominent/upper limits of normal left axillary lymph nodes in the setting of left axillae dissection. Given lymphoma history history, consider PET-CT for further evaluation. 3. No pulmonary embolus. Electronically Signed   By: Tish Frederickson M.D.   On: 11/16/2023 19:56   DG Chest Portable 1 View  Result Date:  11/16/2023 CLINICAL DATA:  Productive cough EXAM: PORTABLE CHEST 1 VIEW COMPARISON:  05/29/2020 FINDINGS: Hyperinflation. New consolidative opacity in the left lung base, likely lower lobe. Acute infiltrate or pneumonia is possible. No pneumothorax or effusion. Normal cardiopericardial silhouette. Surgical clips in the left axillary region. IMPRESSION: Hyperinflation. Consolidative opacity left lower lobe. Acute infiltrate or pneumonia is possible. Recommend follow-up to confirm clearance. Electronically Signed   By: Karen Kays M.D.   On: 11/16/2023 14:23    Procedures .Critical Care  Performed by: Linwood Dibbles, PA-C Authorized by: Linwood Dibbles, PA-C   Critical care provider statement:    Critical care time (minutes):  35   Critical care was time spent personally by me on the following activities:  Development of treatment plan with patient or surrogate, discussions with consultants, evaluation of patient's response to treatment, examination of patient, ordering and review of laboratory studies, ordering and review of radiographic studies, ordering and performing treatments and interventions, pulse oximetry, re-evaluation of patient's condition and review of old charts     Medications Ordered in ED Medications  cefTRIAXone (ROCEPHIN) 1 g in sodium chloride 0.9 % 100 mL IVPB (0 g Intravenous Stopped 11/16/23 1557)  azithromycin (ZITHROMAX) 500 mg in dextrose 5 % 250 mL IVPB (0 mg Intravenous Stopped 11/16/23 1741)  sodium chloride 0.9 % bolus 1,000 mL (0 mLs Intravenous Stopped 11/16/23 1653)  iohexol (OMNIPAQUE) 350 MG/ML injection 75 mL (75 mLs Intravenous Contrast Given 11/16/23 1642)    ED Course/ Medical Decision Making/ A&P   32 year old history of lymphoma, AIHA here for evaluation of URI symptoms over the last 2 weeks.  Productive cough.  Some left-sided chest pain that only occurs with coughing, no exertional or pleuritic pain.  He has no clinical evidence of VTE on exam  however he is mildly tachycardic.  Cannot PERC.  Does have history of lymphoma being followed by [facility].  He has had persistent unintentional weight loss however he has been seen for this previously sounds like they thought this is most likely malabsorption issue.  No night sweats.  Plan on labs, imaging and reassess  Labs and imaging personally viewed and interpreted:  X-ray right pneumonia CBC leukocytosis 21.6 Metabolic panel sodium 134 Lactic acid 0.9 Blood cultures pending Dimer 0.81 EKG without ischemic changes CTA chest with extensive left-sided pneumonia, right tree-in-bud appearance  Patient reassessed.  He meets SIRS/sepsis criteria with tachycardia, leukocytosis and source of infection.  He was given IV antibiotics as well as fluids.  He will be admitted for further management.  I discussed results with patient, his mother via telephone.  We also discussed  his lymphadenopathy given his history of lymphoma needs to touch base with heme-onc for further workup.  Consult with Dr. Margo Aye with medicine who is agreeable to accept patient in transfer for admission  The patient appears reasonably stabilized for admission considering the current resources, flow, and capabilities available in the ED at this time, and I doubt any other [facility] requiring further screening and/or treatment in the ED prior to admission.                                  Medical Decision Making Amount and/or Complexity of Data Reviewed External Data Reviewed: labs, radiology, ECG and notes. Labs: ordered. Decision-making details documented in ED Course. Radiology: ordered and independent interpretation performed. Decision-making details documented in ED Course. ECG/medicine tests: ordered and independent interpretation performed. Decision-making details documented in ED Course.  Risk OTC drugs. Prescription drug management. Parenteral controlled substances. Decision regarding hospitalization. Diagnosis  or treatment significantly limited by social determinants of health.           Final Clinical Impression(s) / ED Diagnoses Final diagnoses:  Pneumonia of both lungs due to infectious organism, unspecified part of lung  SIRS (systemic inflammatory response syndrome) [facility])    Rx / DC Orders ED Discharge Orders     None         Brigido Mera A, PA-C 11/16/23 2055    Benjiman Core, MD 11/18/23 [phone]

## 2023-11-16 NOTE — ED Triage Notes (Signed)
Pt states has had cough, fever, body aches x 2 weeks  States productive cough  Denies pain at this time

## 2023-11-17 DIAGNOSIS — J189 Pneumonia, unspecified organism: Secondary | ICD-10-CM | POA: Diagnosis not present

## 2023-11-17 LAB — HIV ANTIBODY (ROUTINE TESTING W REFLEX): HIV Screen 4th Generation wRfx: NONREACTIVE

## 2023-11-17 NOTE — Progress Notes (Signed)
PROGRESS NOTE    Nicholas Stanton  ZOX:[id number] DOB: December 04, 1991 DOA: 11/16/2023 PCP: Patient, No Pcp Per   Brief Narrative:  This 32 years old male with PMH significant for lymphoma, presented in the ED with complaints of persistent cough and shortness of breath for 2 weeks.  He also reports intermittent fevers, has not checked his temperature at home.  He presented in the ED and was found to be mildly hypoxic and CT scan of the chest revealed extensive consolidation of left lower lobe and abnormal lymphadenopathy.  Patient met criteria for sepsis and started on empiric antibiotics.  COVID and flu were negative.   Assessment & Plan:   Principal Problem:   CAP (community acquired pneumonia) Active Problems:   Hx of lymphoma  Sepsis secondary to community-acquired pneumonia.: Acute hypoxic respiratory failure: Patient presented with tachycardia, tachypnea, leukocytosis, hypoxia requiring supplemental oxygen. CT chest shows consolidative opacity left lower lobe and abnormal lymphadenopathy Continue empiric antibiotics ceftriaxone and Zithromax. Influenza and COVID negative. Continue supplemental oxygen and wean as tolerated.  He does not use oxygen at baseline. Patient received IV fluid resuscitation.  Lymphadenopathy: Likely secondary to lymphoma.   PET scan recommended given history of lymphoma.  Can be obtained outpatient  DVT prophylaxis: Lovenox Code Status: Full code Family Communication: No family at bed side. Disposition Plan:     Status is: Inpatient Remains inpatient appropriate because: Admitted for acute hypoxic respiratory failure secondary to community-acquired pneumonia.    Consultants:  None  Procedures: CT chest  Antimicrobials: Anti-infectives (From admission, onward)    Start     Dose/Rate Route Frequency Ordered Stop   11/17/23 1600  azithromycin (ZITHROMAX) tablet 500 mg        500 mg Oral Daily 11/16/23 2212     11/17/23 1500  cefTRIAXone  (ROCEPHIN) 1 g in sodium chloride 0.9 % 100 mL IVPB        1 g 200 mL/hr over 30 Minutes Intravenous Every 24 hours 11/16/23 2212     11/16/23 1445  cefTRIAXone (ROCEPHIN) 1 g in sodium chloride 0.9 % 100 mL IVPB        1 g 200 mL/hr over 30 Minutes Intravenous  Once 11/16/23 1439 11/16/23 1557   11/16/23 1445  azithromycin (ZITHROMAX) 500 mg in dextrose 5 % 250 mL IVPB        500 mg 250 mL/hr over 60 Minutes Intravenous  Once 11/16/23 1439 11/16/23 1741       Subjective: Patient was seen and examined at bedside. Overnight events noted.   Patient reports doing much better. He remains on 2 L of supplemental oxygen. He does not use oxygen at baseline.  Objective: Vitals:   11/17/23 0203 11/17/23 0655 11/17/23 0856 11/17/23 1254  BP: (!) 146/100 (!) 138/103 (!) 149/113 (!) 144/100  Pulse: 90 90 87 96  Resp: 17 18 16 18   Temp: 98.5 F (36.9 C) 98.2 F (36.8 C) 98.1 F (36.7 C) 97.9 F (36.6 C)  TempSrc: Oral Oral Oral Oral  SpO2: 100% 98% 98% 98%  Weight:      Height:       No intake or output data in the 24 hours ending 11/17/23 1317 Filed Weights   11/16/23 1403  Weight: 55.8 kg    Examination:  General exam: Appears calm and comfortable, not in any acute distress. Respiratory system: Clear to auscultation. Respiratory effort normal.  RR 15 Cardiovascular system: S1 & S2 heard, RRR. No Murmer. No pedal edema. Gastrointestinal system: Abdomen  is non distended, soft and non tender. Normal bowel sounds heard. Central nervous system: Alert and oriented x 3. No focal neurological deficits. Extremities: Symmetric 5 x 5 power. Skin: No rashes, lesions or ulcers Psychiatry: Judgement and insight appear normal. Mood & affect appropriate.     Data Reviewed: I have personally reviewed following labs and imaging studies  CBC: Recent Labs  Lab 11/16/23 1451  WBC 21.6*  NEUTROABS 18.9*  HGB 13.6  HCT 39.3  MCV 90.1  PLT 364   Basic Metabolic Panel: Recent Labs  Lab  11/16/23 1451  NA 134*  K 3.5  CL 99  CO2 23  GLUCOSE 86  BUN 13  CREATININE 1.02  CALCIUM 8.5*   GFR: Estimated Creatinine Clearance: 82.8 mL/min (by C-G formula based on SCr of 1.02 mg/dL). Liver Function Tests: Recent Labs  Lab 11/16/23 1451  AST 16  ALT 17  ALKPHOS 114  BILITOT 0.6  PROT 7.6  ALBUMIN 3.4*   No results for input(s): "LIPASE", "AMYLASE" in the last 168 hours. No results for input(s): "AMMONIA" in the last 168 hours. Coagulation Profile: No results for input(s): "INR", "PROTIME" in the last 168 hours. Cardiac Enzymes: No results for input(s): "CKTOTAL", "CKMB", "CKMBINDEX", "TROPONINI" in the last 168 hours. BNP (last 3 results) No results for input(s): "PROBNP" in the last 8760 hours. HbA1C: No results for input(s): "HGBA1C" in the last 72 hours. CBG: No results for input(s): "GLUCAP" in the last 168 hours. Lipid Profile: No results for input(s): "CHOL", "HDL", "LDLCALC", "TRIG", "CHOLHDL", "LDLDIRECT" in the last 72 hours. Thyroid Function Tests: No results for input(s): "TSH", "T4TOTAL", "FREET4", "T3FREE", "THYROIDAB" in the last 72 hours. Anemia Panel: No results for input(s): "VITAMINB12", "FOLATE", "FERRITIN", "TIBC", "IRON", "RETICCTPCT" in the last 72 hours. Sepsis Labs: Recent Labs  Lab 11/16/23 1451  LATICACIDVEN 0.9    Recent Results (from the past 240 hour(s))  Resp panel by RT-PCR (RSV, Flu A&B, Covid) Anterior Nasal Swab     Status: None   Collection Time: 11/16/23  2:06 PM   Specimen: Anterior Nasal Swab  Result Value Ref Range Status   SARS Coronavirus 2 by RT PCR NEGATIVE NEGATIVE Final    Comment: (NOTE) SARS-CoV-2 target nucleic acids are NOT DETECTED.  The SARS-CoV-2 RNA is generally detectable in upper respiratory specimens during the acute phase of infection. The lowest concentration of SARS-CoV-2 viral copies this assay can detect is 138 copies/mL. A negative result does not preclude SARS-Cov-2 infection and  should not be used as the sole basis for treatment or other patient management decisions. A negative result may occur with  improper specimen collection/handling, submission of specimen other than nasopharyngeal swab, presence of viral mutation(s) within the areas targeted by this assay, and inadequate number of viral copies(<138 copies/mL). A negative result must be combined with clinical observations, patient history, and epidemiological information. The expected result is Negative.  Fact Sheet for Patients:  BloggerCourse.com  Fact Sheet for Healthcare Providers:  SeriousBroker.it  This test is no t yet approved or cleared by the [place] FDA and  has been authorized for detection and/or diagnosis of SARS-CoV-2 by FDA under an Emergency Use Authorization (EUA). This EUA will remain  in effect (meaning this test can be used) for the duration of the COVID-19 declaration under Section 564(b)(1) of the Act, 21 U.S.C.section 360bbb-3(b)(1), unless the authorization is terminated  or revoked sooner.       Influenza A by PCR NEGATIVE NEGATIVE Final   Influenza B  by PCR NEGATIVE NEGATIVE Final    Comment: (NOTE) The Xpert Xpress SARS-CoV-2/FLU/RSV plus assay is intended as an aid in the diagnosis of influenza from Nasopharyngeal swab specimens and should not be used as a sole basis for treatment. Nasal washings and aspirates are unacceptable for Xpert Xpress SARS-CoV-2/FLU/RSV testing.  Fact Sheet for Patients: BloggerCourse.com  Fact Sheet for Healthcare Providers: SeriousBroker.it  This test is not yet approved or cleared by the [place] FDA and has been authorized for detection and/or diagnosis of SARS-CoV-2 by FDA under an Emergency Use Authorization (EUA). This EUA will remain in effect (meaning this test can be used) for the duration of the COVID-19 declaration  under Section 564(b)(1) of the Act, 21 U.S.C. section 360bbb-3(b)(1), unless the authorization is terminated or revoked.     Resp Syncytial Virus by PCR NEGATIVE NEGATIVE Final    Comment: (NOTE) Fact Sheet for Patients: BloggerCourse.com  Fact Sheet for Healthcare Providers: SeriousBroker.it  This test is not yet approved or cleared by the [place] FDA and has been authorized for detection and/or diagnosis of SARS-CoV-2 by FDA under an Emergency Use Authorization (EUA). This EUA will remain in effect (meaning this test can be used) for the duration of the COVID-19 declaration under Section 564(b)(1) of the Act, 21 U.S.C. section 360bbb-3(b)(1), unless the authorization is terminated or revoked.  Performed at [facility], [address] [place], [place] [address]   Blood culture (routine x 2)     Status: None (Preliminary result)   Collection Time: 11/16/23  2:51 PM   Specimen: BLOOD  Result Value Ref Range Status   Specimen Description   Final    BLOOD LEFT ANTECUBITAL Performed at [facility], [address] [place], [place] [address]    Special Requests   Final    BOTTLES DRAWN AEROBIC AND ANAEROBIC Blood Culture adequate volume Performed at [facility], [address] [place], [place] [address]    Culture   Final    NO GROWTH < 24 HOURS Performed at [facility], [address], [place], [place] [address]    Report Status PENDING  Incomplete  Blood culture (routine x 2)     Status: None (Preliminary result)   Collection Time: 11/16/23  3:20 PM   Specimen: BLOOD RIGHT FOREARM  Result Value Ref Range Status   Specimen Description   Final    BLOOD RIGHT FOREARM Performed at [facility], [address], [place], [place] [address]    Special Requests   Final    BOTTLES DRAWN AEROBIC AND ANAEROBIC Blood Culture adequate volume Performed at [facility],  [address] [place], [place] [address]    Culture   Final    NO GROWTH < 24 HOURS Performed at [facility], [address] [place], [place] [address]    Report Status PENDING  Incomplete         Radiology Studies: CT Angio Chest PE W and/or Wo Contrast  Result Date: 11/16/2023 CLINICAL DATA:  Pulmonary embolism (PE) suspected, low to intermediate prob, positive D-dimer cp Pt states has had cough, fever, body aches x 2 weeks. States productive cough. History of lymphoma. EXAM: CT ANGIOGRAPHY CHEST WITH CONTRAST TECHNIQUE: Multidetector CT imaging of the chest was performed using the standard protocol during bolus administration of intravenous contrast. Multiplanar CT image reconstructions and MIPs were obtained to evaluate the vascular anatomy. RADIATION DOSE  REDUCTION: This exam was performed according to the departmental dose-optimization program which includes automated exposure control, adjustment of the mA and/or kV according to patient size and/or use of iterative reconstruction technique. CONTRAST:  75mL OMNIPAQUE IOHEXOL 350 MG/ML SOLN COMPARISON:  None Available. FINDINGS: Cardiovascular: Satisfactory opacification of the pulmonary arteries to the segmental level. No evidence of pulmonary embolism. The main pulmonary artery is normal in caliber. Normal heart size. No significant pericardial effusion. The thoracic aorta is normal in caliber. No atherosclerotic plaque of the thoracic aorta. No coronary artery calcifications. Mediastinum/Nodes: A 1.3 cm prevascular enlarged lymph node. Enlarged precarinal lymph node measuring 1.1 cm. A 1 cm left hilar enlarged lymph node. Prominent but nonenlarged 0.9 cm left axillary lymph node. Left axillary lymph node dissection noted. Prominent but nonenlarged right axillary lymph nodes. No enlarged right hilar or axillary lymph nodes. Thyroid gland, trachea, and esophagus demonstrate no significant findings. Lungs/Pleura: Extensive left lower  lobe peribronchovascular consolidative and ground-glass airspace opacities with mucous plugging. Interval development of tree-in-bud nodularity within the right lower lobe. No pulmonary nodule. No pulmonary mass. No pleural effusion. No pneumothorax. Upper Abdomen: No acute abnormality. Musculoskeletal: No abdominal wall hernia or abnormality. No suspicious lytic or blastic osseous lesions. No acute displaced fracture. Multilevel degenerative changes of the spine. Review of the MIP images confirms the above findings. IMPRESSION: 1. Extensive left lower lobe peribronchovascular consolidative and ground-glass airspace opacities with mucous plugging. Interval development of tree-in-bud nodularity within the right lower lobe. Findings suggestive of infection/inflammation. 2. Mediastinal and left hilar lymphadenopathy. Prominent/upper limits of normal left axillary lymph nodes in the setting of left axillae dissection. Given lymphoma history history, consider PET-CT for further evaluation. 3. No pulmonary embolus. Electronically Signed   By: Tish Frederickson M.D.   On: 11/16/2023 19:56   DG Chest Portable 1 View  Result Date: 11/16/2023 CLINICAL DATA:  Productive cough EXAM: PORTABLE CHEST 1 VIEW COMPARISON:  05/29/2020 FINDINGS: Hyperinflation. New consolidative opacity in the left lung base, likely lower lobe. Acute infiltrate or pneumonia is possible. No pneumothorax or effusion. Normal cardiopericardial silhouette. Surgical clips in the left axillary region. IMPRESSION: Hyperinflation. Consolidative opacity left lower lobe. Acute infiltrate or pneumonia is possible. Recommend follow-up to confirm clearance. Electronically Signed   By: Karen Kays M.D.   On: 11/16/2023 14:23    Scheduled Meds:  azithromycin  500 mg Oral Daily   enoxaparin (LOVENOX) injection  40 mg Subcutaneous Q24H   Continuous Infusions:  cefTRIAXone (ROCEPHIN)  IV       LOS: 1 day    Time spent: 50 mins    Willeen Niece,  MD [facility]   If 7PM-7AM, please contact night-coverage

## 2023-11-17 NOTE — TOC Initial Note (Signed)
Transition of Care [facility]) - Initial/Assessment Note    Patient Details  Name: Nicholas Stanton MRN: [id number] Date of Birth: 04-15-1991  [facility] CM/SW Contact:    Adrian Prows, RN Phone Number: 11/17/2023, 3:46 PM  Clinical Narrative:                 No PCP listed; spoke w/ pt and mother Nicholas Stanton [phone]) in room; pt says he lives at home w/ his mother; he plans to return at d/c; pt has transportation; he denies SDOH risks; pt confirms he does not have a PCP; he declined resources for PCP; his mother states she will make him an appt w/ her new PCP; pt does not have DME, HH services, or home oxygen; pt currently on oxygen via [place]; TOC will follow.  Expected Discharge Plan: Home/Self Care Barriers to Discharge: Continued Medical Work up   Patient Goals and CMS Choice Patient states their goals for this hospitalization and ongoing recovery are:: home CMS Medicare.gov Compare Post Acute Care list provided to:: Patient        Expected Discharge Plan and Services   Discharge Planning Services: CM Consult   Living arrangements for the past 2 months: Single Family Home                                      Prior Living Arrangements/Services Living arrangements for the past 2 months: Single Family Home Lives with:: Parents Patient language and need for interpreter reviewed:: Yes Do you feel safe going back to the place where you live?: Yes      Need for Family Participation in Patient Care: Yes (Comment) Care giver support system in place?: Yes (comment) Current home services:  (n/a) Criminal Activity/Legal Involvement Pertinent to Current Situation/Hospitalization: No - Comment as needed  Activities of Daily Living   ADL Screening (condition at time of admission) Independently performs ADLs?: Yes (appropriate for developmental age) Is the patient deaf or have difficulty hearing?: No Does the patient have difficulty seeing, even when  wearing glasses/contacts?: No Does the patient have difficulty concentrating, remembering, or making decisions?: No  Permission Sought/Granted Permission sought to share information with : Case Manager Permission granted to share information with : Yes, Verbal Permission Granted  Share Information with NAME: Case Manager     Permission granted to share info w Relationship: Nicholas Stanton (mother) [phone]     Emotional Assessment Appearance:: Appears stated age Attitude/Demeanor/Rapport: Gracious Affect (typically observed): Accepting Orientation: : Oriented to Self, Oriented to Place, Oriented to  Time, Oriented to Situation Alcohol / Substance Use: Not Applicable Psych Involvement: No (comment)  Admission diagnosis:  Lymphadenopathy [R59.1] CAP (community acquired pneumonia) [J18.9] Pneumonia of both lungs due to infectious organism, unspecified part of lung [J18.9] Sepsis without acute organ dysfunction, due to unspecified organism [place]) [A41.9] Patient Active Problem List   Diagnosis Date Noted   CAP (community acquired pneumonia) 11/16/2023   Hx of lymphoma 08/27/2015   Atypical pigmented skin lesion 08/27/2015   Eczema 08/27/2015   PCP:  Patient, No Pcp Per Pharmacy:   [id number] - [place] - [address] [place] [address] Phone: [phone] Fax: [phone]  [id number] - [place], [place] - [address] [place] [address] Phone: [phone] Fax: [phone]  [facility] [place] [facility]  [facility] [address] [place] [address] Phone: [phone] Fax: [phone]     Social Determinants of Health (SDOH) Social History: SDOH Screenings   Food Insecurity: No Food Insecurity (11/17/2023)  Housing: Low Risk  (11/17/2023)  Transportation Needs: No Transportation Needs (11/17/2023)  Utilities: Not At Risk  (11/17/2023)  Tobacco Use: High Risk (11/16/2023)   SDOH Interventions: Food Insecurity Interventions: Intervention Not Indicated, Inpatient TOC Housing Interventions: Intervention Not Indicated, Inpatient TOC Transportation Interventions: Intervention Not Indicated, Inpatient TOC Utilities Interventions: Intervention Not Indicated, Inpatient TOC   Readmission Risk Interventions     No data to display

## 2023-11-18 DIAGNOSIS — J189 Pneumonia, unspecified organism: Secondary | ICD-10-CM | POA: Diagnosis not present

## 2023-11-18 DIAGNOSIS — Z8572 Personal history of non-Hodgkin lymphomas: Secondary | ICD-10-CM

## 2023-11-18 MED ORDER — DOXYCYCLINE HYCLATE 100 MG PO CAPS: 100.0000 mg | ORAL_CAPSULE | Freq: Two times a day (BID) | ORAL | 0 refills | Status: AC

## 2023-11-18 MED ORDER — AMOXICILLIN 875 MG PO TABS: 875.0000 mg | ORAL_TABLET | Freq: Two times a day (BID) | ORAL | 0 refills | Status: AC

## 2023-11-18 NOTE — Progress Notes (Signed)
AVS and discharge instructions reviewed w/ patient and family at the bedside. All parties verbalized understanding.

## 2023-11-18 NOTE — Progress Notes (Signed)
SATURATION QUALIFICATIONS: (This note is used to comply with regulatory documentation for home oxygen)  Patient Saturations on Room Air at Rest = 94%  Patient Saturations on Room Air while Ambulating = 89=90%  Please briefly explain why patient needs home oxygen:  Patient ambulated on RA in the hallway and sats dropped to lowest 89% but for most of the session fluctuated between 90-92%. Patient denied SOB, breathing was even and unlabored. HR was 100-110's.

## 2023-11-18 NOTE — Discharge Summary (Signed)
Physician Discharge Summary   Mher Jentsch ZOX:[id number] DOB: April 04, 1991 DOA: 11/16/2023  PCP: Patient, No Pcp Per  Admit date: 11/16/2023 Discharge date:  11/18/2023  Admitted From: Home Disposition:  Home Discharging physician: Lewie Chamber, MD Barriers to discharge: none  Recommendations at discharge: Follow up with oncology   Discharge Condition: stable CODE STATUS: Full Diet recommendation:  Diet Orders (From admission, onward)     Start     Ordered   11/18/23 0000  Diet general        11/18/23 1533   11/16/23 2211  Diet regular Room service appropriate? Yes; Fluid consistency: Thin  Diet effective now       Question Answer Comment  Room service appropriate? Yes   Fluid consistency: Thin      11/16/23 2212            Hospital Course:  Sepsis secondary to community-acquired pneumonia Acute hypoxic respiratory failure - resolved  Patient presented with tachycardia, tachypnea, leukocytosis, hypoxia requiring supplemental oxygen. CT chest shows consolidative opacity left lower lobe and abnormal lymphadenopathy -Treated with Rocephin and azithromycin.  Discharged with amoxicillin and doxycycline to complete 5-day course Influenza and COVID negative -Weaned off oxygen prior to discharge and performed walk test   Lymphadenopathy - noted to have mediastinal and left hilar lymphadenopathy on CT angio chest - PET scan recommended given history of lymphoma; outpatient follow-up with oncology     The patient's acute and chronic medical conditions were treated accordingly. On day of discharge, patient was felt deemed stable for discharge. Patient/family member advised to call PCP or come back to ER if needed.   Principal Diagnosis: CAP (community acquired pneumonia)  Discharge Diagnoses: Active Hospital Problems   Diagnosis Date Noted   CAP (community acquired pneumonia) 11/16/2023   Hx of lymphoma 08/27/2015    Resolved Hospital Problems  No resolved  problems to display.     Discharge Instructions     Diet general   Complete by: As directed    Increase activity slowly   Complete by: As directed       Allergies as of 11/18/2023   No Known Allergies      Medication List     TAKE these medications    amoxicillin 875 MG tablet Commonly known as: AMOXIL Take 1 tablet (875 mg total) by mouth 2 (two) times daily for 2 days. Start taking on: November 19, 2023   cetirizine 10 MG tablet Commonly known as: ZYRTEC Take 10 mg by mouth daily.   doxycycline 100 MG capsule Commonly known as: VIBRAMYCIN Take 1 capsule (100 mg total) by mouth 2 (two) times daily for 2 days. Start taking on: November 19, 2023        Follow-up Information     Donia Guiles, MD. Schedule an appointment as soon as possible for a visit in 2 week(s).   Specialty: Hematology and Oncology Contact information: [address] [place] [address] [phone]                No Known Allergies  Consultations:   Procedures:   Discharge Exam: BP (!) 141/100 (BP Location: Right Arm)   Pulse 87   Temp 97.6 F (36.4 C) (Oral)   Resp 18   Ht 5\' 7"  (1.702 m)   Wt 55.8 kg   SpO2 97%   BMI 19.26 kg/m  Physical Exam Constitutional:      General: He is not in acute distress.    Appearance: Normal appearance.  HENT:     Head: Normocephalic and atraumatic.     Mouth/Throat:     Mouth: Mucous membranes are moist.  Eyes:     Extraocular Movements: Extraocular movements intact.  Cardiovascular:     Rate and Rhythm: Normal rate and regular rhythm.  Pulmonary:     Effort: Pulmonary effort is normal. No respiratory distress.     Breath sounds: Normal breath sounds. No wheezing.  Abdominal:     General: Bowel sounds are normal. There is no distension.     Palpations: Abdomen is soft.     Tenderness: There is no abdominal tenderness.  Musculoskeletal:        General: Normal range of motion.     Cervical back: Normal range of  motion and neck supple.  Skin:    General: Skin is warm and dry.  Neurological:     General: No focal deficit present.     Mental Status: He is alert.  Psychiatric:        Mood and Affect: Mood normal.        Behavior: Behavior normal.      The results of significant diagnostics from this hospitalization (including imaging, microbiology, ancillary and laboratory) are listed below for reference.   Microbiology: Recent Results (from the past 240 hour(s))  Resp panel by RT-PCR (RSV, Flu A&B, Covid) Anterior Nasal Swab     Status: None   Collection Time: 11/16/23  2:06 PM   Specimen: Anterior Nasal Swab  Result Value Ref Range Status   SARS Coronavirus 2 by RT PCR NEGATIVE NEGATIVE Final    Comment: (NOTE) SARS-CoV-2 target nucleic acids are NOT DETECTED.  The SARS-CoV-2 RNA is generally detectable in upper respiratory specimens during the acute phase of infection. The lowest concentration of SARS-CoV-2 viral copies this assay can detect is 138 copies/mL. A negative result does not preclude SARS-Cov-2 infection and should not be used as the sole basis for treatment or other patient management decisions. A negative result may occur with  improper specimen collection/handling, submission of specimen other than nasopharyngeal swab, presence of viral mutation(s) within the areas targeted by this assay, and inadequate number of viral copies(<138 copies/mL). A negative result must be combined with clinical observations, patient history, and epidemiological information. The expected result is Negative.  Fact Sheet for Patients:  BloggerCourse.com  Fact Sheet for Healthcare Providers:  SeriousBroker.it  This test is no t yet approved or cleared by the [place] FDA and  has been authorized for detection and/or diagnosis of SARS-CoV-2 by FDA under an Emergency Use Authorization (EUA). This EUA will remain  in effect (meaning this  test can be used) for the duration of the COVID-19 declaration under Section 564(b)(1) of the Act, 21 U.S.C.section 360bbb-3(b)(1), unless the authorization is terminated  or revoked sooner.       Influenza A by PCR NEGATIVE NEGATIVE Final   Influenza B by PCR NEGATIVE NEGATIVE Final    Comment: (NOTE) The Xpert Xpress SARS-CoV-2/FLU/RSV plus assay is intended as an aid in the diagnosis of influenza from Nasopharyngeal swab specimens and should not be used as a sole basis for treatment. Nasal washings and aspirates are unacceptable for Xpert Xpress SARS-CoV-2/FLU/RSV testing.  Fact Sheet for Patients: BloggerCourse.com  Fact Sheet for Healthcare Providers: SeriousBroker.it  This test is not yet approved or cleared by the [place] FDA and has been authorized for detection and/or diagnosis of SARS-CoV-2 by FDA under an Emergency Use Authorization (EUA). This EUA will remain in  effect (meaning this test can be used) for the duration of the COVID-19 declaration under Section 564(b)(1) of the Act, 21 U.S.C. section 360bbb-3(b)(1), unless the authorization is terminated or revoked.     Resp Syncytial Virus by PCR NEGATIVE NEGATIVE Final    Comment: (NOTE) Fact Sheet for Patients: BloggerCourse.com  Fact Sheet for Healthcare Providers: SeriousBroker.it  This test is not yet approved or cleared by the [place] FDA and has been authorized for detection and/or diagnosis of SARS-CoV-2 by FDA under an Emergency Use Authorization (EUA). This EUA will remain in effect (meaning this test can be used) for the duration of the COVID-19 declaration under Section 564(b)(1) of the Act, 21 U.S.C. section 360bbb-3(b)(1), unless the authorization is terminated or revoked.  Performed at [facility], [address] [place], [place] [address]   Blood culture (routine x 2)      Status: None (Preliminary result)   Collection Time: 11/16/23  2:51 PM   Specimen: BLOOD  Result Value Ref Range Status   Specimen Description   Final    BLOOD LEFT ANTECUBITAL Performed at [facility], [address] [place], [place] [address]    Special Requests   Final    BOTTLES DRAWN AEROBIC AND ANAEROBIC Blood Culture adequate volume Performed at [facility], [address] [place], [place] [address]    Culture   Final    NO GROWTH 2 DAYS Performed at [facility], [address], [place], [place] [address]    Report Status PENDING  Incomplete  Blood culture (routine x 2)     Status: None (Preliminary result)   Collection Time: 11/16/23  3:20 PM   Specimen: BLOOD RIGHT FOREARM  Result Value Ref Range Status   Specimen Description   Final    BLOOD RIGHT FOREARM Performed at [facility], [address], [place], [place] [address]    Special Requests   Final    BOTTLES DRAWN AEROBIC AND ANAEROBIC Blood Culture adequate volume Performed at [facility], [address] [place], [place] [address]    Culture   Final    NO GROWTH 2 DAYS Performed at [facility], [address], [place], [place] [address]    Report Status PENDING  Incomplete     Labs: BNP (last 3 results) No results for input(s): "BNP" in the last 8760 hours. Basic Metabolic Panel: Recent Labs  Lab 11/16/23 1451  NA 134*  K 3.5  CL 99  CO2 23  GLUCOSE 86  BUN 13  CREATININE 1.02  CALCIUM 8.5*   Liver Function Tests: Recent Labs  Lab 11/16/23 1451  AST 16  ALT 17  ALKPHOS 114  BILITOT 0.6  PROT 7.6  ALBUMIN 3.4*   No results for input(s): "LIPASE", "AMYLASE" in the last 168 hours. No results for input(s): "AMMONIA" in the last 168 hours. CBC: Recent Labs  Lab 11/16/23 1451  WBC 21.6*  NEUTROABS 18.9*  HGB 13.6  HCT 39.3  MCV 90.1  PLT 364   Cardiac Enzymes: No results for input(s): "CKTOTAL", "CKMB", "CKMBINDEX",  "TROPONINI" in the last 168 hours. BNP: Invalid input(s): "POCBNP" CBG: No results for input(s): "GLUCAP" in the last 168 hours. D-Dimer Recent Labs    11/16/23 1440  DDIMER 0.81*   Hgb A1c No results for input(s): "HGBA1C" in the last 72 hours. Lipid Profile No results for input(s): "CHOL", "HDL", "LDLCALC", "TRIG", "CHOLHDL", "  LDLDIRECT" in the last 72 hours. Thyroid function studies No results for input(s): "TSH", "T4TOTAL", "T3FREE", "THYROIDAB" in the last 72 hours.  Invalid input(s): "FREET3" Anemia work up No results for input(s): "VITAMINB12", "FOLATE", "FERRITIN", "TIBC", "IRON", "RETICCTPCT" in the last 72 hours. Urinalysis    Component Value Date/Time   COLORURINE YELLOW 05/29/2020 1825   APPEARANCEUR CLEAR 05/29/2020 1825   LABSPEC <1.005 (L) 05/29/2020 1825   PHURINE 7.0 05/29/2020 1825   GLUCOSEU NEGATIVE 05/29/2020 1825   HGBUR NEGATIVE 05/29/2020 1825   BILIRUBINUR NEGATIVE 05/29/2020 1825   KETONESUR NEGATIVE 05/29/2020 1825   PROTEINUR NEGATIVE 05/29/2020 1825   NITRITE NEGATIVE 05/29/2020 1825   LEUKOCYTESUR NEGATIVE 05/29/2020 1825   Sepsis Labs Recent Labs  Lab 11/16/23 1451  WBC 21.6*   Microbiology Recent Results (from the past 240 hour(s))  Resp panel by RT-PCR (RSV, Flu A&B, Covid) Anterior Nasal Swab     Status: None   Collection Time: 11/16/23  2:06 PM   Specimen: Anterior Nasal Swab  Result Value Ref Range Status   SARS Coronavirus 2 by RT PCR NEGATIVE NEGATIVE Final    Comment: (NOTE) SARS-CoV-2 target nucleic acids are NOT DETECTED.  The SARS-CoV-2 RNA is generally detectable in upper respiratory specimens during the acute phase of infection. The lowest concentration of SARS-CoV-2 viral copies this assay can detect is 138 copies/mL. A negative result does not preclude SARS-Cov-2 infection and should not be used as the sole basis for treatment or other patient management decisions. A negative result may occur with  improper  specimen collection/handling, submission of specimen other than nasopharyngeal swab, presence of viral mutation(s) within the areas targeted by this assay, and inadequate number of viral copies(<138 copies/mL). A negative result must be combined with clinical observations, patient history, and epidemiological information. The expected result is Negative.  Fact Sheet for Patients:  BloggerCourse.com  Fact Sheet for Healthcare Providers:  SeriousBroker.it  This test is no t yet approved or cleared by the [place] FDA and  has been authorized for detection and/or diagnosis of SARS-CoV-2 by FDA under an Emergency Use Authorization (EUA). This EUA will remain  in effect (meaning this test can be used) for the duration of the COVID-19 declaration under Section 564(b)(1) of the Act, 21 U.S.C.section 360bbb-3(b)(1), unless the authorization is terminated  or revoked sooner.       Influenza A by PCR NEGATIVE NEGATIVE Final   Influenza B by PCR NEGATIVE NEGATIVE Final    Comment: (NOTE) The Xpert Xpress SARS-CoV-2/FLU/RSV plus assay is intended as an aid in the diagnosis of influenza from Nasopharyngeal swab specimens and should not be used as a sole basis for treatment. Nasal washings and aspirates are unacceptable for Xpert Xpress SARS-CoV-2/FLU/RSV testing.  Fact Sheet for Patients: BloggerCourse.com  Fact Sheet for Healthcare Providers: SeriousBroker.it  This test is not yet approved or cleared by the [place] FDA and has been authorized for detection and/or diagnosis of SARS-CoV-2 by FDA under an Emergency Use Authorization (EUA). This EUA will remain in effect (meaning this test can be used) for the duration of the COVID-19 declaration under Section 564(b)(1) of the Act, 21 U.S.C. section 360bbb-3(b)(1), unless the authorization is terminated or revoked.     Resp  Syncytial Virus by PCR NEGATIVE NEGATIVE Final    Comment: (NOTE) Fact Sheet for Patients: BloggerCourse.com  Fact Sheet for Healthcare Providers: SeriousBroker.it  This test is not yet approved or cleared by the [place] FDA and has been authorized for detection and/or  diagnosis of SARS-CoV-2 by FDA under an Emergency Use Authorization (EUA). This EUA will remain in effect (meaning this test can be used) for the duration of the COVID-19 declaration under Section 564(b)(1) of the Act, 21 U.S.C. section 360bbb-3(b)(1), unless the authorization is terminated or revoked.  Performed at [facility], [address] [place], [place] [address]   Blood culture (routine x 2)     Status: None (Preliminary result)   Collection Time: 11/16/23  2:51 PM   Specimen: BLOOD  Result Value Ref Range Status   Specimen Description   Final    BLOOD LEFT ANTECUBITAL Performed at [facility], [address] [place], [place] [address]    Special Requests   Final    BOTTLES DRAWN AEROBIC AND ANAEROBIC Blood Culture adequate volume Performed at [facility], [address] [place], [place] [address]    Culture   Final    NO GROWTH 2 DAYS Performed at [facility], [address], [place], [place] [address]    Report Status PENDING  Incomplete  Blood culture (routine x 2)     Status: None (Preliminary result)   Collection Time: 11/16/23  3:20 PM   Specimen: BLOOD RIGHT FOREARM  Result Value Ref Range Status   Specimen Description   Final    BLOOD RIGHT FOREARM Performed at [facility], [address] [place], [place] [address]    Special Requests   Final    BOTTLES DRAWN AEROBIC AND ANAEROBIC Blood Culture adequate volume Performed at [facility], [address] [place], [place] [address]    Culture   Final    NO GROWTH 2 DAYS Performed at [facility], [address], [place], [place] [address]    Report Status PENDING  Incomplete    Procedures/Studies: CT Angio Chest PE W and/or Wo Contrast  Result Date: 11/16/2023 CLINICAL DATA:  Pulmonary embolism (PE) suspected, low to intermediate prob, positive D-dimer cp Pt states has had cough, fever, body aches x 2 weeks. States productive cough. History of lymphoma. EXAM: CT ANGIOGRAPHY CHEST WITH CONTRAST TECHNIQUE: Multidetector CT imaging of the chest was performed using the standard protocol during bolus administration of intravenous contrast. Multiplanar CT image reconstructions and MIPs were obtained to evaluate the vascular anatomy. RADIATION DOSE REDUCTION: This exam was performed according to the departmental dose-optimization program which includes automated exposure control, adjustment of the mA and/or kV according to patient size and/or use of iterative reconstruction technique. CONTRAST:  75mL OMNIPAQUE IOHEXOL 350 MG/ML SOLN COMPARISON:  None Available. FINDINGS: Cardiovascular: Satisfactory opacification of the pulmonary arteries to the segmental level. No evidence of pulmonary embolism. The main pulmonary artery is normal in caliber. Normal heart size. No significant pericardial effusion. The thoracic aorta is normal in caliber. No atherosclerotic plaque of the thoracic aorta. No coronary artery calcifications. Mediastinum/Nodes: A 1.3 cm prevascular enlarged lymph node. Enlarged precarinal lymph node measuring 1.1 cm. A 1 cm left hilar enlarged lymph node. Prominent but nonenlarged 0.9 cm left axillary lymph node. Left axillary lymph node dissection noted. Prominent but nonenlarged right axillary lymph nodes. No enlarged right hilar or axillary lymph nodes. Thyroid gland, trachea, and esophagus demonstrate no significant findings. Lungs/Pleura: Extensive left lower lobe peribronchovascular consolidative and ground-glass airspace opacities with mucous plugging. Interval development of tree-in-bud nodularity  within the right lower lobe. No pulmonary nodule. No pulmonary mass. No pleural effusion. No pneumothorax.  Upper Abdomen: No acute abnormality. Musculoskeletal: No abdominal wall hernia or abnormality. No suspicious lytic or blastic osseous lesions. No acute displaced fracture. Multilevel degenerative changes of the spine. Review of the MIP images confirms the above findings. IMPRESSION: 1. Extensive left lower lobe peribronchovascular consolidative and ground-glass airspace opacities with mucous plugging. Interval development of tree-in-bud nodularity within the right lower lobe. Findings suggestive of infection/inflammation. 2. Mediastinal and left hilar lymphadenopathy. Prominent/upper limits of normal left axillary lymph nodes in the setting of left axillae dissection. Given lymphoma history history, consider PET-CT for further evaluation. 3. No pulmonary embolus. Electronically Signed   By: Tish Frederickson M.D.   On: 11/16/2023 19:56   DG Chest Portable 1 View  Result Date: 11/16/2023 CLINICAL DATA:  Productive cough EXAM: PORTABLE CHEST 1 VIEW COMPARISON:  05/29/2020 FINDINGS: Hyperinflation. New consolidative opacity in the left lung base, likely lower lobe. Acute infiltrate or pneumonia is possible. No pneumothorax or effusion. Normal cardiopericardial silhouette. Surgical clips in the left axillary region. IMPRESSION: Hyperinflation. Consolidative opacity left lower lobe. Acute infiltrate or pneumonia is possible. Recommend follow-up to confirm clearance. Electronically Signed   By: Karen Kays M.D.   On: 11/16/2023 14:23     Time coordinating discharge: Over 30 minutes    Lewie Chamber, MD  [facility] 11/18/2023, 3:43 PM

## 2023-11-18 NOTE — Plan of Care (Signed)

## 2023-11-21 LAB — CULTURE, BLOOD (ROUTINE X 2)
Culture: NO GROWTH
Culture: NO GROWTH
Special Requests: ADEQUATE
Special Requests: ADEQUATE
